# Patient Record
Sex: Male | Born: 1975 | Race: Black or African American | Hispanic: No | Marital: Single | Smoking: Current every day smoker
Health system: Southern US, Community
[De-identification: ages and names within clinical notes are randomized; demographics above are authoritative.]

## PROBLEM LIST (undated history)

## (undated) HISTORY — PX: EYE SURGERY: SHX253

---

## 2012-07-09 ENCOUNTER — Ambulatory Visit (INDEPENDENT_AMBULATORY_CARE_PROVIDER_SITE_OTHER): Payer: BC Managed Care – PPO | Admitting: Family Medicine

## 2012-07-09 VITALS — BP 126/80 | HR 91 | Temp 98.2°F | Resp 16 | Ht 70.0 in | Wt 197.4 lb

## 2012-07-09 DIAGNOSIS — M7918 Myalgia, other site: Secondary | ICD-10-CM

## 2012-07-09 DIAGNOSIS — L0231 Cutaneous abscess of buttock: Secondary | ICD-10-CM

## 2012-07-09 DIAGNOSIS — IMO0001 Reserved for inherently not codable concepts without codable children: Secondary | ICD-10-CM

## 2012-07-09 MED ORDER — SULFAMETHOXAZOLE-TRIMETHOPRIM 800-160 MG PO TABS: 1.0000 | ORAL_TABLET | Freq: Two times a day (BID) | ORAL | Status: DC

## 2012-07-09 MED ORDER — HYDROCODONE-ACETAMINOPHEN 5-325 MG PO TABS: 1.0000 | ORAL_TABLET | Freq: Four times a day (QID) | ORAL | Status: DC | PRN

## 2012-07-09 NOTE — Progress Notes (Signed)
Subjective: 37 year old man here for his first time. He has had for 4 days of pain in his left buttock. He just started and he doesn't know why. Constantly worse. He can't sit down. He is a Community education officer. He lives with his girlfriend, but she doesn't have any similar problems. Is not allergic to any medications on a regular medicines. He has been healthy.  Objective: Pleasant young man who is very uncomfortable. He can't sit down. He has left buttock it has a lot of swelling medially. Superior to and lateral to the anus is a large abscess. Area of induration probably is in the neighborhood of 7 cm. It is warm to touch.  Assessment: Left buttock abscess  Plan: He will require I and D. and antibiotics and pain medications.

## 2012-07-09 NOTE — Progress Notes (Signed)
   Patient ID: DONTRAE MORINI MRN: [id number], DOB: April 18, 1976, 37 y.o. Date of Encounter: 07/09/2012, 10:54 AM    PROCEDURE NOTE: Verbal consent obtained. Risks and benefits of the procedure were explained to the patient. Patient made an informed decision to proceed with the procedure. Betadine prep per usual protocol. Local anesthesia obtained with 1% plain lidocaine 3 cc.  1 cm incision made with 11 blade along lesion.  Culture taken. Copious purulence expressed. Lesion explored revealing no loculations. Irrigated with normal saline. Packed with 1/4 plain packing. Dressed. Wound care instructions including precautions with patient. Patient tolerated the procedure well. Recheck in 48 hours.     Signed, Eula Listen, PA-C 07/09/2012 10:54 AM

## 2012-07-11 ENCOUNTER — Ambulatory Visit (INDEPENDENT_AMBULATORY_CARE_PROVIDER_SITE_OTHER): Payer: BC Managed Care – PPO | Admitting: Physician Assistant

## 2012-07-11 ENCOUNTER — Encounter: Payer: Self-pay | Admitting: Physician Assistant

## 2012-07-11 VITALS — BP 116/76 | HR 85 | Temp 98.5°F | Resp 16 | Ht 70.0 in | Wt 193.2 lb

## 2012-07-11 DIAGNOSIS — L03317 Cellulitis of buttock: Secondary | ICD-10-CM

## 2012-07-11 DIAGNOSIS — L0231 Cutaneous abscess of buttock: Secondary | ICD-10-CM

## 2012-07-11 LAB — WOUND CULTURE

## 2012-07-11 NOTE — Progress Notes (Signed)
   Patient ID: DINA MOBLEY MRN: [id number], DOB: [birth year] 37 y.o. Date of Encounter: 07/11/2012, 8:42 AM  Primary Physician: No primary provider on file.  Chief Complaint: Wound care   See previous note  HPI: 37 y.o. y/o male presents for wound care s/p I&D on 07/10/12 Doing well No issues or complaints Afebrile/ no chills No nausea or vomiting Tolerating Bactrim Pain much improved Daily dressing change Previous note reviewed  No past medical history on file.   Home Meds: Prior to Admission medications   Medication Sig Start Date End Date Taking? Authorizing Provider  HYDROcodone-acetaminophen (NORCO/VICODIN) 5-325 MG per tablet Take 1 tablet by mouth every 6 (six) hours as needed for pain. 07/09/12  Yes Ryan M Dunn, PA-C  sulfamethoxazole-trimethoprim (BACTRIM DS,SEPTRA DS) 800-160 MG per tablet Take 1 tablet by mouth 2 (two) times daily. 07/09/12  Yes Ryan M Dunn, PA-C    Allergies: No Known Allergies  ROS: Constitutional: Afebrile, no chills Cardiovascular: negative for chest pain or palpitations Dermatological: Positive for wound and pain (improved). Negative for erythema or warmth  GI: No nausea or vomiting   EXAM: Physical Exam: Blood pressure 116/76, pulse 85, temperature 98.5 F (36.9 C), temperature source Oral, resp. rate 16, height 5\' 10"  (1.778 m), weight 193 lb 3.2 oz (87.635 kg), SpO2 98.00%., Body mass index is 27.72 kg/(m^2). General: Well developed, well nourished, in no acute distress. Nontoxic appearing. Head: Normocephalic, atraumatic, sclera non-icteric.  Neck: Supple. Lungs: Breathing is unlabored. Heart: Normal rate. Skin:  Warm and moist. Dressing and packing in place. No induration or erythema. Much improved tenderness to palpation. Neuro: Alert and oriented X 3. Moves all extremities spontaneously. Normal gait.  Psych:  Responds to questions appropriately with a normal affect.   PROCEDURE: Dressing and packing removed. No purulence  expressed Wound bed healthy Irrigated with 1% plain lidocaine 5 cc. Repacked with 1/4 inch plain packing Dressing applied  LAB: Culture: Preliminary, multiple organisms, none predominant.   A/P: 37 y.o. y/o male with improving cellulitis/abscess as above s/p I&D on 07/10/12 -Wound care per above -Continue Bactrim -Pain well controlled, and much improved -Daily dressing changes -Recheck 48 hours  Signed, Eula Listen, PA-C 07/11/2012 8:42 AM

## 2012-07-13 ENCOUNTER — Ambulatory Visit (INDEPENDENT_AMBULATORY_CARE_PROVIDER_SITE_OTHER): Payer: BC Managed Care – PPO | Admitting: Physician Assistant

## 2012-07-13 VITALS — BP 138/88 | HR 88 | Temp 98.4°F | Resp 16 | Ht 69.25 in | Wt 198.2 lb

## 2012-07-13 DIAGNOSIS — L0231 Cutaneous abscess of buttock: Secondary | ICD-10-CM

## 2012-07-13 DIAGNOSIS — L03317 Cellulitis of buttock: Secondary | ICD-10-CM

## 2012-07-13 NOTE — Progress Notes (Signed)
Patient ID: Corey Waller MRN: [id number], DOB: 29-Mar-1976 37 y.o. Date of Encounter: 07/13/2012, 9:17 AM  Chief Complaint: Wound care   See previous note  HPI: 37 y.o. y/o male presents for wound care s/p I&D on 07/09/12. Doing well No issues or complaints Afebrile/ no chills No nausea or vomiting Tolerating bactrim. Pain resolved. Packing fell out last night while changing dressing.  Daily dressing change Previous note reviewed  No past medical history on file.   Home Meds: Prior to Admission medications   Medication Sig Start Date End Date Taking? Authorizing Provider  HYDROcodone-acetaminophen (NORCO/VICODIN) 5-325 MG per tablet Take 1 tablet by mouth every 6 (six) hours as needed for pain. 07/09/12  Yes Ryan M Dunn, PA-C  sulfamethoxazole-trimethoprim (BACTRIM DS,SEPTRA DS) 800-160 MG per tablet Take 1 tablet by mouth 2 (two) times daily. 07/09/12  Yes Ryan M Dunn, PA-C    Allergies: No Known Allergies  ROS: Constitutional: Afebrile, no chills Cardiovascular: negative for chest pain or palpitations Dermatological: Positive for wound. Negative for erythema, pain, or warmth.  GI: No nausea or vomiting   EXAM: Physical Exam: Blood pressure 138/88, pulse 88, temperature 98.4 F (36.9 C), temperature source Oral, resp. rate 16, height 5' 9.25" (1.759 m), weight 198 lb 3.2 oz (89.903 kg), SpO2 100.00%., Body mass index is 29.06 kg/(m^2). General: Well developed, well nourished, in no acute distress. Nontoxic appearing. Head: Normocephalic, atraumatic, sclera non-icteric.  Neck: Supple. Lungs: Breathing is unlabored. Heart: Normal rate. Skin:  Warm and moist. Dressing and packing in place. No induration, erythema, or tenderness to palpation. Neuro: Alert and oriented X 3. Moves all extremities spontaneously. Normal gait.  Psych:  Responds to questions appropriately with a normal affect.       PROCEDURE: Dressing removed. Packing not in place.  No purulence  expressed Wound bed healthy Irrigated with 1% plain lidocaine 5 cc. Not repacked.  Dressing applied  LAB: Culture:   A/P: 37 y.o. y/o male with buttocks cellulitis/abscess as above s/p I&D on Wound care per above Continue bactrim. Pain well controlled Daily dressing changes Recheck as needed.   Grier Mitts, PA-C 07/13/2012 9:17 AM

## 2014-11-29 ENCOUNTER — Other Ambulatory Visit (HOSPITAL_COMMUNITY): Payer: Self-pay | Admitting: Chiropractic Medicine

## 2014-11-29 ENCOUNTER — Ambulatory Visit (HOSPITAL_COMMUNITY)
Admission: RE | Admit: 2014-11-29 | Discharge: 2014-11-29 | Disposition: A | Discharge status: Home / Self Care | Payer: BLUE CROSS/BLUE SHIELD | Source: Ambulatory Visit | Attending: Chiropractic Medicine | Admitting: Chiropractic Medicine

## 2014-11-29 DIAGNOSIS — G8929 Other chronic pain: Principal | ICD-10-CM

## 2014-11-29 DIAGNOSIS — M5032 Other cervical disc degeneration, mid-cervical region: Secondary | ICD-10-CM | POA: Insufficient documentation

## 2014-11-29 DIAGNOSIS — M2578 Osteophyte, vertebrae: Secondary | ICD-10-CM | POA: Insufficient documentation

## 2014-11-29 DIAGNOSIS — M542 Cervicalgia: Secondary | ICD-10-CM

## 2014-11-29 DIAGNOSIS — M549 Dorsalgia, unspecified: Secondary | ICD-10-CM | POA: Diagnosis not present

## 2014-11-29 DIAGNOSIS — M546 Pain in thoracic spine: Secondary | ICD-10-CM | POA: Diagnosis not present
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
## 2017-05-23 ENCOUNTER — Encounter (HOSPITAL_COMMUNITY): Payer: Self-pay | Admitting: Emergency Medicine

## 2017-05-23 ENCOUNTER — Emergency Department (HOSPITAL_COMMUNITY)
Admission: EM | Admit: 2017-05-23 | Discharge: 2017-05-23 | Disposition: A | Discharge status: Home / Self Care | Payer: BLUE CROSS/BLUE SHIELD | Attending: Emergency Medicine | Admitting: Emergency Medicine

## 2017-05-23 ENCOUNTER — Emergency Department (HOSPITAL_COMMUNITY): Payer: BLUE CROSS/BLUE SHIELD

## 2017-05-23 DIAGNOSIS — Z79899 Other long term (current) drug therapy: Secondary | ICD-10-CM | POA: Diagnosis not present

## 2017-05-23 DIAGNOSIS — M940 Chondrocostal junction syndrome [Tietze]: Secondary | ICD-10-CM | POA: Diagnosis not present

## 2017-05-23 DIAGNOSIS — R079 Chest pain, unspecified: Secondary | ICD-10-CM | POA: Diagnosis present

## 2017-05-23 LAB — BASIC METABOLIC PANEL
ANION GAP: 8 (ref 5–15)
BUN: 8 mg/dL (ref 6–20)
CHLORIDE: 104 mmol/L (ref 101–111)
CO2: 24 mmol/L (ref 22–32)
CREATININE: 1.27 mg/dL — AB (ref 0.61–1.24)
Calcium: 9.3 mg/dL (ref 8.9–10.3)
GFR calc non Af Amer: >60 mL/min (ref >60)
GLUCOSE: 89 mg/dL (ref 65–99)
POTASSIUM: 4.1 mmol/L (ref 3.5–5.1)
SODIUM: 136 mmol/L (ref 135–145)

## 2017-05-23 LAB — I-STAT TROPONIN, ED: Troponin i, poc: 0 ng/mL (ref 0.00–0.08)

## 2017-05-23 LAB — CBC
HCT: 47.3 % (ref 39.0–52.0)
Hemoglobin: 15.7 g/dL (ref 13.0–17.0)
MCH: 30 pg (ref 26.0–34.0)
MCHC: 33.2 g/dL (ref 30.0–36.0)
MCV: 90.4 fL (ref 78.0–100.0)
PLATELETS: 242 10*3/uL (ref 150–400)
RBC: 5.23 MIL/uL (ref 4.22–5.81)
RDW: 13.2 % (ref 11.5–15.5)
WBC: 4.5 10*3/uL (ref 4.0–10.5)

## 2017-05-23 MED ORDER — KETOROLAC TROMETHAMINE 60 MG/2ML IM SOLN
30.0000 mg | Freq: Once | INTRAMUSCULAR | Status: AC
  Administered 2017-05-23: 30 mg via INTRAMUSCULAR
  Filled 2017-05-23: qty 2

## 2017-05-23 NOTE — Discharge Instructions (Signed)
Please return for any problem.  Please use ibuprofen at home for symptomatic relief.  Recommended dose of Ibuprofen is 600-800 mg orally every 8 hours.

## 2017-05-23 NOTE — ED Triage Notes (Signed)
Pt to ER for evaluation of 2 day hx of central chest pain worse with palpation and movement, states he was doing "nothing" when the pain began. States no medical hx. Denies any other symptoms. States had a cough 2 weeks ago and has continued to cough. Pt a/o x4.

## 2017-05-23 NOTE — ED Provider Notes (Signed)
[facility]   CSN: [id number] Arrival date & time: 05/23/17  1205     History   Chief Complaint Chief Complaint  Patient presents with  . Chest Pain    HPI Corey Waller is a 41 y.o. male.  41 year old male without significant medical history presents with complaint of anterior chest wall pain.  He reports a mild URI about 1-1/2 weeks ago.  He reports significant coughing while he had that URI.  He now presents with complaint of left midsternal chest discomfort over the last 3 days.  This pain is worse with palpation.  It is worse with movement or deep breath.  He denies associated nausea, diaphoresis, vomiting, shortness of breath, or lower extremity discomfort or swelling.  He denies prior CAD.  He denies prior history of hypertension or hyperlipidemia.     The history is provided by the patient.  Chest Pain   This is a new problem. The current episode started more than 2 days ago. The problem occurs constantly. The problem has not changed since onset.The pain is associated with movement, coughing and lifting. The pain is present in the substernal region. The pain is mild. The quality of the pain is described as stabbing. The pain does not radiate. The symptoms are aggravated by certain positions and deep breathing. He has tried rest for the symptoms. The treatment provided mild relief.    History reviewed. No pertinent past medical history.  There are no active problems to display for this patient.   Past Surgical History:  Procedure Laterality Date  . EYE SURGERY         Home Medications    Prior to Admission medications   Medication Sig Start Date End Date Taking? Authorizing Provider  HYDROcodone-acetaminophen (NORCO/VICODIN) 5-325 MG per tablet Take 1 tablet by mouth every 6 (six) hours as needed for pain. 07/09/12   Dunn, Raymon Muttonyan M, PA-C  sulfamethoxazole-trimethoprim (BACTRIM DS,SEPTRA DS) 800-160 MG per tablet  Take 1 tablet by mouth 2 (two) times daily. 07/09/12   Sondra Bargesunn, Ryan M, PA-C    Family History History reviewed. No pertinent family history.  Social History Social History   Tobacco Use  . Smoking status: Current Every Day Smoker    Packs/day: 1.00    Years: 15.00    Pack years: 15.00  . Smokeless tobacco: Never Used  Substance Use Topics  . Alcohol use: Yes  . Drug use: No     Allergies   Patient has no known allergies.   Review of Systems Review of Systems  Cardiovascular: Positive for chest pain.  All other systems reviewed and are negative.    Physical Exam Updated Vital Signs BP (!) 143/103 (BP Location: Right Arm)   Pulse 73   Temp 98.1 F (36.7 C) (Oral)   Resp 18   SpO2 98%   Physical Exam  Constitutional: He is oriented to person, place, and time. He appears well-developed and well-nourished. No distress.  HENT:  Head: Normocephalic and atraumatic.  Mouth/Throat: Oropharynx is clear and moist.  Eyes: Conjunctivae and EOM are normal. Pupils are equal, round, and reactive to light.  Neck: Normal range of motion. Neck supple.  Cardiovascular: Normal rate, regular rhythm and normal heart sounds.  Tender to palpation to left sternal border  Palpation reproduces symptoms entirely.    Pulmonary/Chest: Effort normal and breath sounds normal. No respiratory distress.  Abdominal: Soft. He exhibits no distension. There is no tenderness.  Musculoskeletal: Normal  range of motion. He exhibits no edema or deformity.  Neurological: He is alert and oriented to person, place, and time.  Skin: Skin is warm and dry.  Psychiatric: He has a normal mood and affect.  Nursing note and vitals reviewed.    ED Treatments / Results  Labs (all labs ordered are listed, but only abnormal results are displayed) Labs Reviewed  BASIC METABOLIC PANEL - Abnormal; Notable for the following components:      Result Value   Creatinine, Ser 1.27 (*)    All other components within  normal limits  CBC  I-STAT TROPONIN, ED    EKG  EKG Interpretation  Date/Time:  Sunday May 23 2017 12:12:52 EST Ventricular Rate:  78 PR Interval:  132 QRS Duration: 88 QT Interval:  352 QTC Calculation: 401 R Axis:   84 Text Interpretation:  Normal sinus rhythm Normal ECG Confirmed by Kristine RoyalMessick, [place] Natarajan [id number]) on 05/23/2017 1:32:45 PM       Radiology Dg Chest 2 View  Result Date: 05/23/2017 CLINICAL DATA:  Midsternal chest pain for the past 3 days. EXAM: CHEST  2 VIEW COMPARISON:  None. FINDINGS: The heart size and mediastinal contours are within normal limits. Both lungs are clear. The visualized skeletal structures are unremarkable. IMPRESSION: No active cardiopulmonary disease. Electronically Signed   By: Obie DredgeWilliam T Derry M.D.   On: 05/23/2017 13:16    Procedures Procedures (including critical care time)  Medications Ordered in ED Medications  ketorolac (TORADOL) injection 30 mg (not administered)     Initial Impression / Assessment and Plan / ED Course  I have reviewed the triage vital signs and the nursing notes.  Pertinent labs & imaging results that were available during my care of the patient were reviewed by me and considered in my medical decision making (see chart for details).     MSE Complete   Presentation is entirely consistent with costochondritis.  Pain is reproduced with palpation to the anterior left sternal border.  Symptoms present for the last 3 days.  EKG is normal and without acute ischemic changes.  Troponin x1 is negative. CXR is normal.  Patient educated regarding costochondritis treatment at home.  Strict return precautions are given and understood.  Close follow-up is advised.  Final Clinical Impressions(s) / ED Diagnoses   Final diagnoses:  Costochondritis    ED Discharge Orders    None       Wynetta FinesMessick, Jomarion Mish C, MD 05/23/17 1349

## 2018-04-22 IMAGING — DX DG CHEST 2V
2 series · 2 of 2 positions shown · non-contrast
Comparison: None.

CLINICAL DATA: Midsternal chest pain for the past 3 days.

EXAM:
CHEST  2 VIEW

[chest pa]
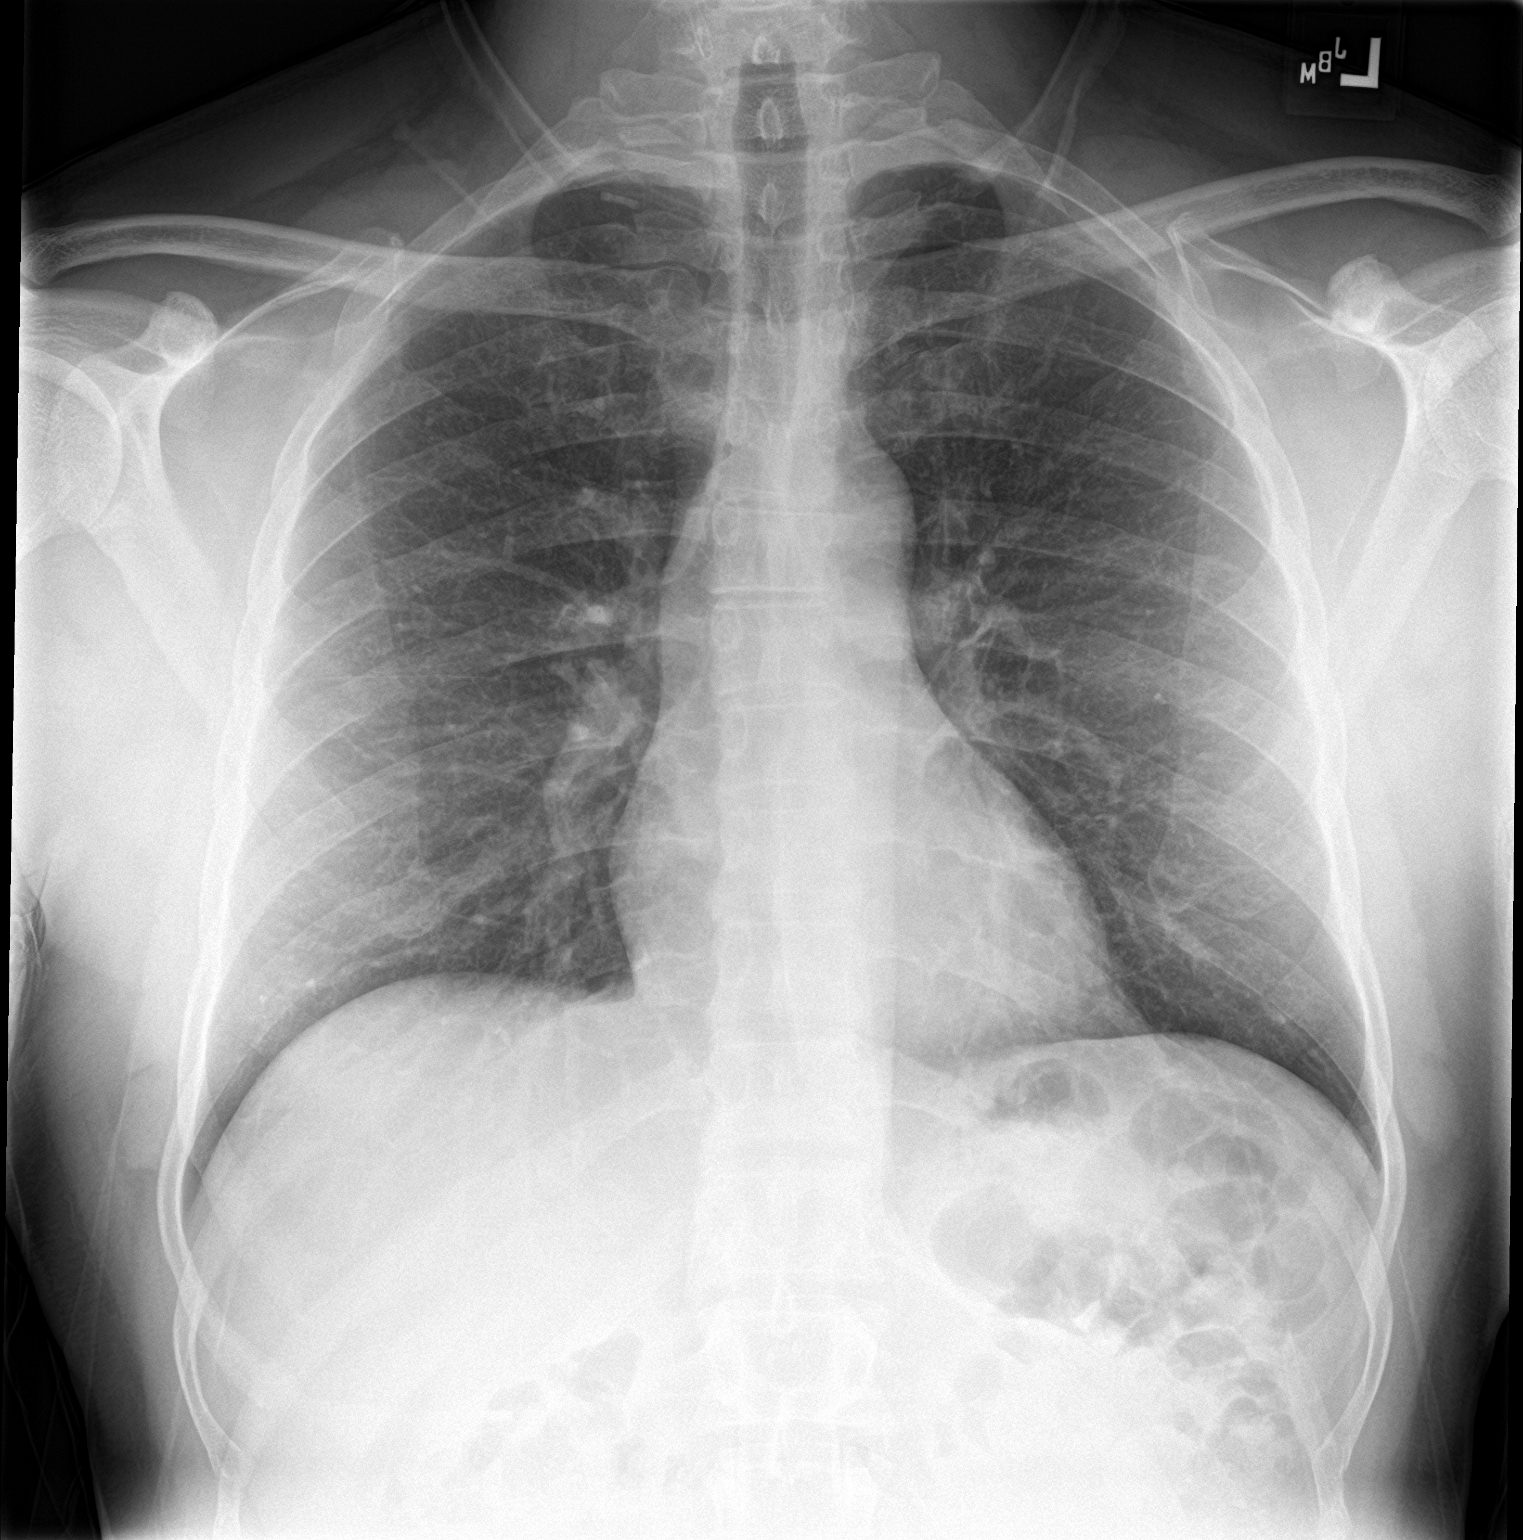

[chest lat]
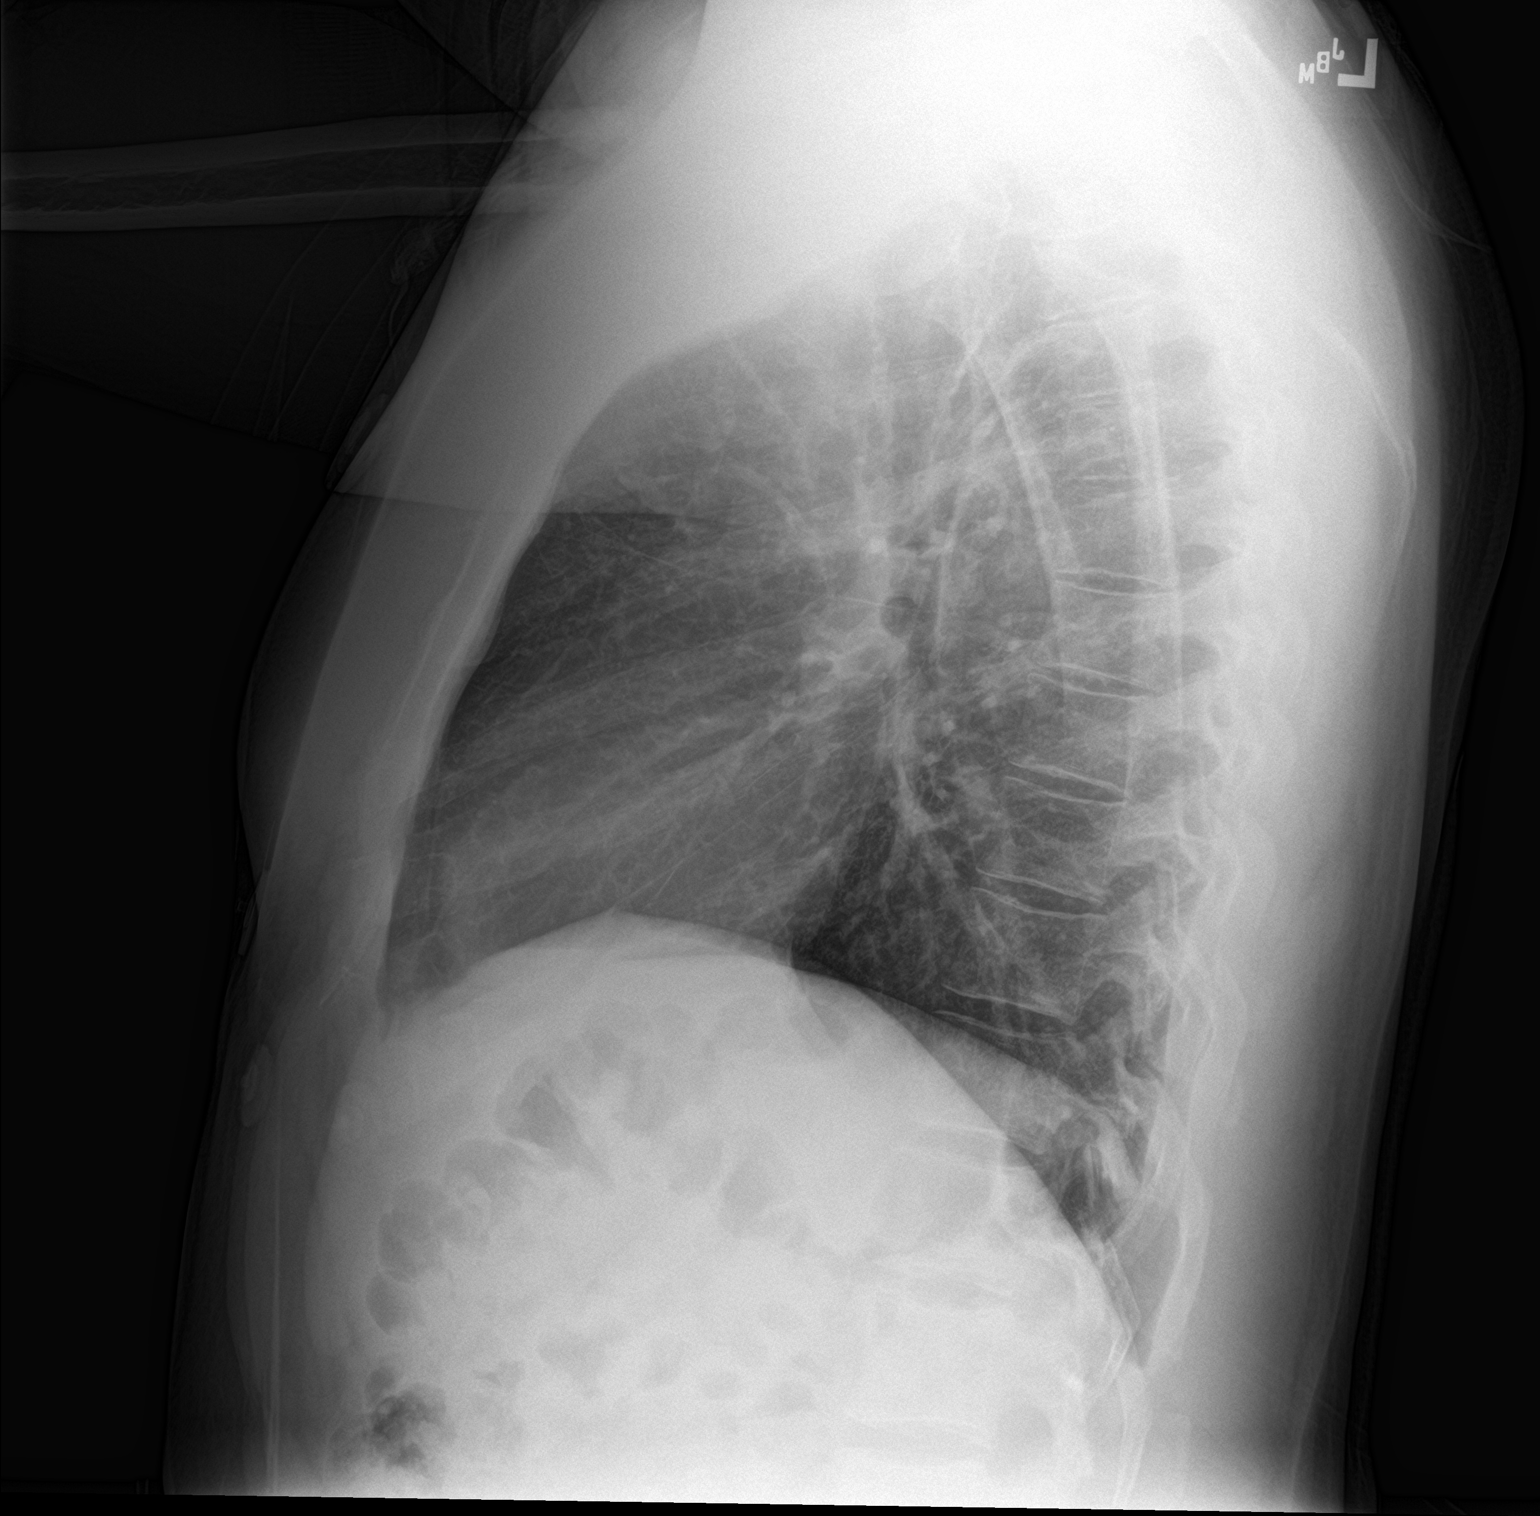

[2 of 2 positions shown; findings below may reference images not displayed]

FINDINGS: The heart size and mediastinal contours are within normal limits.
Both lungs are clear. The visualized skeletal structures are
unremarkable.
IMPRESSION: No active cardiopulmonary disease.

## 2021-08-08 DIAGNOSIS — J039 Acute tonsillitis, unspecified: Secondary | ICD-10-CM | POA: Diagnosis not present

## 2021-08-08 DIAGNOSIS — R070 Pain in throat: Secondary | ICD-10-CM | POA: Diagnosis not present

## 2022-07-27 DIAGNOSIS — Z720 Tobacco use: Secondary | ICD-10-CM | POA: Diagnosis not present

## 2022-08-23 DIAGNOSIS — R062 Wheezing: Secondary | ICD-10-CM | POA: Diagnosis not present

## 2022-08-23 DIAGNOSIS — R059 Cough, unspecified: Secondary | ICD-10-CM | POA: Diagnosis not present

## 2022-08-23 DIAGNOSIS — J101 Influenza due to other identified influenza virus with other respiratory manifestations: Secondary | ICD-10-CM | POA: Diagnosis not present

## 2022-09-01 DIAGNOSIS — M7022 Olecranon bursitis, left elbow: Secondary | ICD-10-CM | POA: Diagnosis not present

## 2022-09-20 DIAGNOSIS — Z720 Tobacco use: Secondary | ICD-10-CM | POA: Diagnosis not present

## 2022-09-28 DIAGNOSIS — Z720 Tobacco use: Secondary | ICD-10-CM | POA: Diagnosis not present

## 2022-12-22 DIAGNOSIS — M79671 Pain in right foot: Secondary | ICD-10-CM | POA: Diagnosis not present

## 2022-12-22 DIAGNOSIS — L923 Foreign body granuloma of the skin and subcutaneous tissue: Secondary | ICD-10-CM | POA: Diagnosis not present

## 2023-03-08 DIAGNOSIS — M778 Other enthesopathies, not elsewhere classified: Secondary | ICD-10-CM | POA: Diagnosis not present

## 2023-03-08 DIAGNOSIS — M19041 Primary osteoarthritis, right hand: Secondary | ICD-10-CM | POA: Diagnosis not present

## 2023-04-08 ENCOUNTER — Ambulatory Visit (HOSPITAL_BASED_OUTPATIENT_CLINIC_OR_DEPARTMENT_OTHER): Payer: BC Managed Care – PPO | Admitting: Family Medicine

## 2023-04-08 ENCOUNTER — Encounter (HOSPITAL_BASED_OUTPATIENT_CLINIC_OR_DEPARTMENT_OTHER): Payer: Self-pay | Admitting: Family Medicine

## 2023-04-08 VITALS — BP 152/103 | HR 70 | Ht 70.0 in | Wt 212.0 lb

## 2023-04-08 DIAGNOSIS — R030 Elevated blood-pressure reading, without diagnosis of hypertension: Secondary | ICD-10-CM | POA: Insufficient documentation

## 2023-04-08 DIAGNOSIS — F17200 Nicotine dependence, unspecified, uncomplicated: Secondary | ICD-10-CM | POA: Insufficient documentation

## 2023-04-08 DIAGNOSIS — Z1211 Encounter for screening for malignant neoplasm of colon: Secondary | ICD-10-CM

## 2023-04-08 DIAGNOSIS — Z0000 Encounter for general adult medical examination without abnormal findings: Secondary | ICD-10-CM

## 2023-04-08 DIAGNOSIS — R519 Headache, unspecified: Secondary | ICD-10-CM | POA: Diagnosis not present

## 2023-04-08 NOTE — Assessment & Plan Note (Signed)
Has attempted quitting in the past.  Did not note significant nicotine cravings with use of nicotine replacement therapy.  Primary concerns are related to mood changes, primarily related to anxiety.  He also noted weight gain when quitting smoking previously.  We discussed considerations related to this.  If he did well from a screening standpoint, we could look to interventions to assist with controlling symptoms which occur with tobacco cessation, primarily related to anxiety.  Patient will consider and we will continue to assess readiness to quit at future office visits

## 2023-04-08 NOTE — Assessment & Plan Note (Signed)
Possibly related to workout supplements, also reviewed that caffeine can also be trigger for headache.  Patient does regularly consume coffee and was also getting caffeine through preworkout.  Total caffeine intake has decreased with elimination of preworkout.  Advised on looking to utilize a preworkout supplement that does not have caffeine to see if this can be tolerated without triggering of headaches. Advised on maintaining headache diary should these return.  Recommend tracking headache frequency, severity.  Also recommend monitoring dietary intake including food and beverages, water intake, nightly sleep.

## 2023-04-08 NOTE — Progress Notes (Signed)
New Patient Office Visit  Subjective    Patient ID: Corey Waller, male    DOB: 11/24/1975  Age: 47 y.o. MRN: [id number]  CC:  Chief Complaint  Patient presents with   New Patient (Initial Visit)    New patient a few weeks ago was having headaches every single day this has since went away     HPI Corey Waller presents to establish care Last PCP - never had one  Has had some recent headaches -reports a few weeks ago he was having daily headaches.  He generally was using preworkout with caffeine as well as creatine as part of exercise regimen.  He indicates that he stopped both of these workout supplements and headaches subsided shortly thereafter.  He generally has not had any issues with headaches in the past.  Tobacco use: has smoked about 20 years, about 1 ppd. Did try to quit earlier this year, quit for about 3-4 months. Noted increased anxiety and weight gain when not smoking. Utilized patch at that time.  Denies any prior hospitalizations; has had LASIK surgery in the past  FH - reports HTN in family. Not aware of any cancer in the family.  Patient is originally from [place]. Has lived here for about 17 years. Patient works running Programme researcher, broadcasting/film/video - Research officer, trade union. Outside of work, he enjoys exercising, has to do this early in the morning due to working long hours.  Outpatient Encounter Medications as of 04/08/2023  Medication Sig   [DISCONTINUED] HYDROcodone-acetaminophen (NORCO/VICODIN) 5-325 MG per tablet Take 1 tablet by mouth every 6 (six) hours as needed for pain. (Patient not taking: Reported on 04/08/2023)   [DISCONTINUED] sulfamethoxazole-trimethoprim (BACTRIM DS,SEPTRA DS) 800-160 MG per tablet Take 1 tablet by mouth 2 (two) times daily.   No facility-administered encounter medications on file as of 04/08/2023.    History reviewed. No pertinent past medical history.  Past Surgical History:  Procedure Laterality Date   EYE SURGERY      History  reviewed. No pertinent family history.  Social History   Socioeconomic History   Marital status: Single    Spouse name: Not on file   Number of children: Not on file   Years of education: Not on file   Highest education level: 12th grade  Occupational History   Not on file  Tobacco Use   Smoking status: Every Day    Current packs/day: 1.00    Average packs/day: 1 pack/day for 15.0 years (15.0 ttl pk-yrs)    Types: Cigarettes    Passive exposure: Current   Smokeless tobacco: Never  Vaping Use   Vaping status: Never Used  Substance and Sexual Activity   Alcohol use: Yes   Drug use: No   Sexual activity: Not on file  Other Topics Concern   Not on file  Social History Narrative   Not on file   Social Determinants of Health   Financial Resource Strain: Low Risk  (04/07/2023)   Overall Financial Resource Strain (CARDIA)    Difficulty of Paying Living Expenses: Not hard at all  Food Insecurity: No Food Insecurity (04/07/2023)   Hunger Vital Sign    Worried About Running Out of Food in the Last Year: Never true    Ran Out of Food in the Last Year: Never true  Transportation Needs: No Transportation Needs (04/07/2023)   PRAPARE - Administrator, Civil Service (Medical): No    Lack of Transportation (Non-Medical): No  Physical Activity: Sufficiently  Active (04/07/2023)   Exercise Vital Sign    Days of Exercise per Week: 4 days    Minutes of Exercise per Session: 60 min  Stress: No Stress Concern Present (04/07/2023)   [employer] of Occupational Health - Occupational Stress Questionnaire    Feeling of Stress : Only a little  Social Connections: Socially Isolated (04/07/2023)   Social Connection and Isolation Panel [NHANES]    Frequency of Communication with Friends and Family: Twice a week    Frequency of Social Gatherings with Friends and Family: Never    Attends Religious Services: Never    Database administrator or Organizations: No    Attends Museum/gallery exhibitions officer: Not on file    Marital Status: Divorced  Intimate Partner Violence: Not At Risk (04/08/2023)   Humiliation, Afraid, Rape, and Kick questionnaire    Fear of Current or Ex-Partner: No    Emotionally Abused: No    Physically Abused: No    Sexually Abused: No    Objective    BP (!) 152/103 (BP Location: Left Arm, Patient Position: Sitting, Cuff Size: Normal)   Pulse 70   Ht 5\' 10"  (1.778 m)   Wt 212 lb (96.2 kg)   SpO2 100%   BMI 30.42 kg/m   Physical Exam  47 year old male in no acute distress Cardiovascular exam with regular rate and rhythm Lungs clear to auscultation bilaterally  Assessment & Plan:   Elevated blood pressure reading in office without diagnosis of hypertension Assessment & Plan: Blood pressure borderline in office today, no prior history of hypertension.  He does have family history of blood pressure issues and thus we will need to keep a close eye on blood pressure.  Recommend lifestyle modifications with general limitation of added salt to the diet.  He is currently regularly active with his gym routine.  We will continue to monitor blood pressure in the office and determine need for pharmacotherapy   Special screening for malignant neoplasms, colon -     Cologuard  Wellness examination -     CBC with Differential/Platelet; Future -     Comprehensive metabolic panel; Future -     Hemoglobin A1c; Future -     Lipid panel; Future -     TSH Rfx on Abnormal to Free T4; Future  Tobacco use disorder Assessment & Plan: Has attempted quitting in the past.  Did not note significant nicotine cravings with use of nicotine replacement therapy.  Primary concerns are related to mood changes, primarily related to anxiety.  He also noted weight gain when quitting smoking previously.  We discussed considerations related to this.  If he did well from a screening standpoint, we could look to interventions to assist with controlling symptoms which occur  with tobacco cessation, primarily related to anxiety.  Patient will consider and we will continue to assess readiness to quit at future office visits   Nonintractable headache, unspecified chronicity pattern, unspecified headache type Assessment & Plan: Possibly related to workout supplements, also reviewed that caffeine can also be trigger for headache.  Patient does regularly consume coffee and was also getting caffeine through preworkout.  Total caffeine intake has decreased with elimination of preworkout.  Advised on looking to utilize a preworkout supplement that does not have caffeine to see if this can be tolerated without triggering of headaches. Advised on maintaining headache diary should these return.  Recommend tracking headache frequency, severity.  Also recommend monitoring dietary intake including food and beverages, water  intake, nightly sleep.   Return in about 1 month (around 05/09/2023) for CPE with fasting labs 1 week prior.    ___________________________________________ Everlee [place]ush [place], MD, ABFM, CAQSM Primary Care and Sports Medicine [facility]

## 2023-04-08 NOTE — Assessment & Plan Note (Signed)
Blood pressure borderline in office today, no prior history of hypertension.  He does have family history of blood pressure issues and thus we will need to keep a close eye on blood pressure.  Recommend lifestyle modifications with general limitation of added salt to the diet.  He is currently regularly active with his gym routine.  We will continue to monitor blood pressure in the office and determine need for pharmacotherapy

## 2023-04-21 DIAGNOSIS — Z1211 Encounter for screening for malignant neoplasm of colon: Secondary | ICD-10-CM | POA: Diagnosis not present

## 2023-04-28 LAB — COLOGUARD: COLOGUARD: NEGATIVE

## 2023-05-05 ENCOUNTER — Other Ambulatory Visit (HOSPITAL_BASED_OUTPATIENT_CLINIC_OR_DEPARTMENT_OTHER): Payer: Self-pay | Admitting: Family Medicine

## 2023-05-05 ENCOUNTER — Other Ambulatory Visit (HOSPITAL_BASED_OUTPATIENT_CLINIC_OR_DEPARTMENT_OTHER): Payer: BC Managed Care – PPO

## 2023-05-05 DIAGNOSIS — Z0000 Encounter for general adult medical examination without abnormal findings: Secondary | ICD-10-CM | POA: Diagnosis not present

## 2023-05-06 LAB — COMPREHENSIVE METABOLIC PANEL
ALT: 14 [IU]/L (ref 0–44)
AST: 16 [IU]/L (ref 0–40)
Albumin: 4.2 g/dL (ref 4.1–5.1)
Alkaline Phosphatase: 63 [IU]/L (ref 44–121)
BUN/Creatinine Ratio: 7 — ABNORMAL LOW (ref 9–20)
BUN: 9 mg/dL (ref 6–24)
Bilirubin Total: 0.5 mg/dL (ref 0.0–1.2)
CO2: 22 mmol/L (ref 20–29)
Calcium: 9.6 mg/dL (ref 8.7–10.2)
Chloride: 103 mmol/L (ref 96–106)
Creatinine, Ser: 1.32 mg/dL — ABNORMAL HIGH (ref 0.76–1.27)
Globulin, Total: 2.7 g/dL (ref 1.5–4.5)
Glucose: 99 mg/dL (ref 70–99)
Potassium: 4.3 mmol/L (ref 3.5–5.2)
Sodium: 139 mmol/L (ref 134–144)
Total Protein: 6.9 g/dL (ref 6.0–8.5)
eGFR: 67 mL/min/{1.73_m2} (ref >59)

## 2023-05-06 LAB — LIPID PANEL
Chol/HDL Ratio: 10.4 ratio — ABNORMAL HIGH (ref 0.0–5.0)
Cholesterol, Total: 333 mg/dL — ABNORMAL HIGH (ref 100–199)
HDL: 32 mg/dL — ABNORMAL LOW (ref >39)
LDL Chol Calc (NIH): 259 mg/dL — ABNORMAL HIGH (ref 0–99)
Triglycerides: 205 mg/dL — ABNORMAL HIGH (ref 0–149)
VLDL Cholesterol Cal: 42 mg/dL — ABNORMAL HIGH (ref 5–40)

## 2023-05-06 LAB — CBC WITH DIFFERENTIAL/PLATELET
Basophils Absolute: 0 10*3/uL (ref 0.0–0.2)
Basos: 0 %
EOS (ABSOLUTE): 0.1 10*3/uL (ref 0.0–0.4)
Eos: 2 %
Hematocrit: 47.1 % (ref 37.5–51.0)
Hemoglobin: 15.6 g/dL (ref 13.0–17.7)
Immature Grans (Abs): 0 10*3/uL (ref 0.0–0.1)
Immature Granulocytes: 0 %
Lymphocytes Absolute: 2.5 10*3/uL (ref 0.7–3.1)
Lymphs: 54 %
MCH: 29.2 pg (ref 26.6–33.0)
MCHC: 33.1 g/dL (ref 31.5–35.7)
MCV: 88 fL (ref 79–97)
Monocytes Absolute: 0.5 10*3/uL (ref 0.1–0.9)
Monocytes: 12 %
Neutrophils Absolute: 1.5 10*3/uL (ref 1.4–7.0)
Neutrophils: 32 %
Platelets: 232 10*3/uL (ref 150–450)
RBC: 5.34 x10E6/uL (ref 4.14–5.80)
RDW: 13.1 % (ref 11.6–15.4)
WBC: 4.7 10*3/uL (ref 3.4–10.8)

## 2023-05-06 LAB — HEMOGLOBIN A1C
Est. average glucose Bld gHb Est-mCnc: 123 mg/dL
Hgb A1c MFr Bld: 5.9 % — ABNORMAL HIGH (ref 4.8–5.6)

## 2023-05-06 LAB — TSH RFX ON ABNORMAL TO FREE T4: TSH: 1.05 u[IU]/mL (ref 0.450–4.500)

## 2023-05-12 ENCOUNTER — Ambulatory Visit (INDEPENDENT_AMBULATORY_CARE_PROVIDER_SITE_OTHER): Payer: BC Managed Care – PPO | Admitting: Family Medicine

## 2023-05-12 ENCOUNTER — Encounter (HOSPITAL_BASED_OUTPATIENT_CLINIC_OR_DEPARTMENT_OTHER): Payer: Self-pay | Admitting: Family Medicine

## 2023-05-12 VITALS — BP 138/98 | HR 97 | Ht 70.0 in | Wt 206.7 lb

## 2023-05-12 DIAGNOSIS — Z23 Encounter for immunization: Secondary | ICD-10-CM

## 2023-05-12 DIAGNOSIS — E785 Hyperlipidemia, unspecified: Secondary | ICD-10-CM | POA: Insufficient documentation

## 2023-05-12 DIAGNOSIS — Z0000 Encounter for general adult medical examination without abnormal findings: Secondary | ICD-10-CM | POA: Insufficient documentation

## 2023-05-12 MED ORDER — ROSUVASTATIN CALCIUM 20 MG PO TABS: 20.0000 mg | ORAL_TABLET | Freq: Every day | ORAL | 1 refills | Status: DC

## 2023-05-12 NOTE — Progress Notes (Signed)
Subjective:    CC: Annual Physical Exam  HPI: Corey Waller is a 47 y.o. presenting for annual physical  I reviewed the past medical history, family history, social history, surgical history, and allergies today and no changes were needed.  Please see the problem list section below in epic for further details.  Past Medical History: History reviewed. No pertinent past medical history. Past Surgical History: Past Surgical History:  Procedure Laterality Date   EYE SURGERY     Social History: Social History   Socioeconomic History   Marital status: Single    Spouse name: Not on file   Number of children: Not on file   Years of education: Not on file   Highest education level: 12th grade  Occupational History   Not on file  Tobacco Use   Smoking status: Every Day    Current packs/day: 1.00    Average packs/day: 1 pack/day for 15.0 years (15.0 ttl pk-yrs)    Types: Cigarettes    Passive exposure: Current   Smokeless tobacco: Never  Vaping Use   Vaping status: Never Used  Substance and Sexual Activity   Alcohol use: Yes   Drug use: No   Sexual activity: Not on file  Other Topics Concern   Not on file  Social History Narrative   Not on file   Social Determinants of Health   Financial Resource Strain: Low Risk  (04/07/2023)   Overall Financial Resource Strain (CARDIA)    Difficulty of Paying Living Expenses: Not hard at all  Food Insecurity: No Food Insecurity (04/07/2023)   Hunger Vital Sign    Worried About Running Out of Food in the Last Year: Never true    Ran Out of Food in the Last Year: Never true  Transportation Needs: No Transportation Needs (04/07/2023)   PRAPARE - Administrator, Civil Service (Medical): No    Lack of Transportation (Non-Medical): No  Physical Activity: Sufficiently Active (04/07/2023)   Exercise Vital Sign    Days of Exercise per Week: 4 days    Minutes of Exercise per Session: 60 min  Stress: No Stress Concern Present  (04/07/2023)   [employer] of Occupational Health - Occupational Stress Questionnaire    Feeling of Stress : Only a little  Social Connections: Socially Isolated (04/07/2023)   Social Connection and Isolation Panel [NHANES]    Frequency of Communication with Friends and Family: Twice a week    Frequency of Social Gatherings with Friends and Family: Never    Attends Religious Services: Never    Database administrator or Organizations: No    Attends Engineer, structural: Not on file    Marital Status: Divorced   Family History: History reviewed. No pertinent family history. Allergies: No Known Allergies Medications: See med rec.  Review of Systems: No headache, visual changes, nausea, vomiting, diarrhea, constipation, dizziness, abdominal pain, skin rash, fevers, chills, night sweats, swollen lymph nodes, weight loss, chest pain, body aches, joint swelling, muscle aches, shortness of breath, mood changes, visual or auditory hallucinations.  Objective:    BP (!) 136/90 (BP Location: Left Arm, Patient Position: Sitting, Cuff Size: Normal)   Pulse 97   Ht 5\' 10"  (1.778 m)   Wt 206 lb 11.2 oz (93.8 kg)   SpO2 96%   BMI 29.66 kg/m   General: Well Developed, well nourished, and in no acute distress.  Neuro: Alert and oriented x3, extra-ocular muscles intact, sensation grossly intact. Cranial nerves II through  XII are intact, motor, sensory, and coordinative functions are all intact. HEENT: Normocephalic, atraumatic, pupils equal round reactive to light, neck supple, no masses, no lymphadenopathy, thyroid nonpalpable. Oropharynx, nasopharynx, external ear canals are unremarkable. Arcus cornealis present Skin: Warm and dry, no rashes noted.  Cardiac: Regular rate and rhythm, no murmurs rubs or gallops.  Respiratory: Clear to auscultation bilaterally. Not using accessory muscles, speaking in full sentences.  Abdominal: Soft, nontender, nondistended, positive bowel sounds, no  masses, no organomegaly.  Musculoskeletal: Shoulder, elbow, wrist, hip, knee, ankle stable, and with full range of motion.  Impression and Recommendations:    Need for tetanus booster -     Tdap vaccine greater than or equal to 7yo IM  Wellness examination Assessment & Plan: Routine HCM labs reviewed. HCM reviewed/discussed. Anticipatory guidance regarding healthy weight, lifestyle and choices given. Recommend healthy diet.  Recommend approximately 150 minutes/week of moderate intensity exercise Recommend regular dental and vision exams Always use seatbelt/lap and shoulder restraints Recommend using smoke alarms and checking batteries at least twice a year Recommend using sunscreen when outside Discussed colon cancer screening recommendations, options.  Patient is UTD Discussed tetanus immunization recommendations, patient agreed to proceed with this today   Hyperlipidemia, unspecified hyperlipidemia type Assessment & Plan: Notably elevated total cholesterol and LDL cholesterol on recent labs.  On exam today, patient also noted to have arcus cornealis present.  Patient is over age of 72 and so uncertain if arcus cornealis present prior to age 44.  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  This in conjunction with level of LDL elevation likely indicates familial hypercholesterolemia based upon [place] [facility] network diagnostic criteria (5 points for LDL level, 4 points for arcus cornealis).  Does report family history of heart disease, denies any history of heart attack. We discussed options today.  I do think that patient requires pharmacotherapy to assist with lowering cholesterol numbers.  We discussed option of initiating statin, referral to cardiology/lipid clinic.  After discussion, patient elected to proceed with initial statin therapy.  We discussed potential risk and side effects related to this class of  medication.  Will start with rosuvastatin at 20 mg and monitor lipid panel about 2 months to assess response.  We will target goal of less than 70-100 for LDL.  This would correlate to reduction of greater than 60% LDL.   Other orders -     Rosuvastatin Calcium; Take 1 tablet (20 mg total) by mouth daily.  Dispense: 90 tablet; Refill: 1  Return in about 4 months (around 09/09/2023) for hyperlipidemia, med check, blood pressure.   ___________________________________________ Johnanna Bakke de [place], MD, ABFM, CAQSM Primary Care and Sports Medicine [facility]

## 2023-05-12 NOTE — Assessment & Plan Note (Addendum)
Notably elevated total cholesterol and LDL cholesterol on recent labs.  On exam today, patient also noted to have arcus cornealis present.  Patient is over age of 55 and so uncertain if arcus cornealis present prior to age 47.  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  This in conjunction with level of LDL elevation likely indicates familial hypercholesterolemia based upon [place] [facility] network diagnostic criteria (5 points for LDL level, 4 points for arcus cornealis).  Does report family history of heart disease, denies any history of heart attack. We discussed options today.  I do think that patient requires pharmacotherapy to assist with lowering cholesterol numbers.  We discussed option of initiating statin, referral to cardiology/lipid clinic.  After discussion, patient elected to proceed with initial statin therapy.  We discussed potential risk and side effects related to this class of medication.  Will start with rosuvastatin at 20 mg and monitor lipid panel about 2 months to assess response.  We will target goal of less than 70-100 for LDL.  This would correlate to reduction of greater than 60% LDL.

## 2023-05-12 NOTE — Assessment & Plan Note (Signed)
Routine HCM labs reviewed. HCM reviewed/discussed. Anticipatory guidance regarding healthy weight, lifestyle and choices given. Recommend healthy diet.  Recommend approximately 150 minutes/week of moderate intensity exercise Recommend regular dental and vision exams Always use seatbelt/lap and shoulder restraints Recommend using smoke alarms and checking batteries at least twice a year Recommend using sunscreen when outside Discussed colon cancer screening recommendations, options.  Patient is UTD Discussed tetanus immunization recommendations, patient agreed to proceed with this today 

## 2023-07-14 ENCOUNTER — Other Ambulatory Visit (HOSPITAL_BASED_OUTPATIENT_CLINIC_OR_DEPARTMENT_OTHER): Payer: BC Managed Care – PPO | Admitting: Family Medicine

## 2023-07-14 ENCOUNTER — Other Ambulatory Visit (HOSPITAL_BASED_OUTPATIENT_CLINIC_OR_DEPARTMENT_OTHER): Payer: BC Managed Care – PPO

## 2023-07-14 ENCOUNTER — Other Ambulatory Visit (HOSPITAL_BASED_OUTPATIENT_CLINIC_OR_DEPARTMENT_OTHER): Payer: Self-pay | Admitting: Family Medicine

## 2023-07-14 DIAGNOSIS — E785 Hyperlipidemia, unspecified: Secondary | ICD-10-CM | POA: Diagnosis not present

## 2023-07-15 LAB — LIPID PANEL
Chol/HDL Ratio: 4.5 {ratio} (ref 0.0–5.0)
Cholesterol, Total: 158 mg/dL (ref 100–199)
HDL: 35 mg/dL — ABNORMAL LOW (ref >39)
LDL Chol Calc (NIH): 106 mg/dL — ABNORMAL HIGH (ref 0–99)
Triglycerides: 91 mg/dL (ref 0–149)
VLDL Cholesterol Cal: 17 mg/dL (ref 5–40)

## 2023-07-19 ENCOUNTER — Encounter (HOSPITAL_BASED_OUTPATIENT_CLINIC_OR_DEPARTMENT_OTHER): Payer: Self-pay | Admitting: Family Medicine

## 2023-09-15 ENCOUNTER — Encounter (HOSPITAL_BASED_OUTPATIENT_CLINIC_OR_DEPARTMENT_OTHER): Payer: Self-pay | Admitting: Family Medicine

## 2023-09-15 ENCOUNTER — Ambulatory Visit (HOSPITAL_BASED_OUTPATIENT_CLINIC_OR_DEPARTMENT_OTHER): Payer: BC Managed Care – PPO | Admitting: Family Medicine

## 2023-09-15 VITALS — BP 129/86 | HR 70 | Ht 70.0 in | Wt 209.8 lb

## 2023-09-15 DIAGNOSIS — R61 Generalized hyperhidrosis: Secondary | ICD-10-CM | POA: Insufficient documentation

## 2023-09-15 DIAGNOSIS — E785 Hyperlipidemia, unspecified: Secondary | ICD-10-CM | POA: Diagnosis not present

## 2023-09-15 DIAGNOSIS — R030 Elevated blood-pressure reading, without diagnosis of hypertension: Secondary | ICD-10-CM

## 2023-09-15 NOTE — Assessment & Plan Note (Signed)
 Blood pressure elevated in office today, did improve on recheck.  He does have family history of blood pressure issues and thus we will need to keep a close eye on blood pressure.  Recommend lifestyle modifications with general limitation of added salt to the diet.  He is currently regularly active with his gym routine.  We will continue to monitor blood pressure in the office and determine need for pharmacotherapy Recommend intermittent monitoring of blood pressure at home, DASH diet

## 2023-09-15 NOTE — Assessment & Plan Note (Signed)
 Initially noted some myalgias with starting medication, however these have largely resolved.  Primarily affected shoulders at onset.  No current symptoms of muscle aches. At this time, can continue with rosuvastatin at current dose.  We will continue to monitor cholesterol panel moving forward.

## 2023-09-15 NOTE — Patient Instructions (Signed)
  Medication Instructions:  Your physician recommends that you continue on your current medications as directed. Please refer to the Current Medication list given to you today. --If you need a refill on any your medications before your next appointment, please call your pharmacy first. If no refills are authorized on file call the office.-- Lab Work: Your physician has recommended that you have lab work today: today If you have labs (blood work) drawn today and your tests are completely normal, you will receive your results via MyChart message OR a phone call from our staff.  Please ensure you check your voicemail in the event that you authorized detailed messages to be left on a delegated number. If you have any lab test that is abnormal or we need to change your treatment, we will call you to review the results.    Follow-Up: Your next appointment:   Your physician recommends that you schedule a follow-up appointment in: 3 month follow up with Dr. de [place]  You will receive a text message or e-mail with a link to a survey about your care and experience with [place] today! We would greatly appreciate your feedback!   Thanks for letting us be apart of your health journey!!  Primary Care and Sports Medicine   Dr. Ceasar Mons [place]   We encourage you to activate your patient portal called "MyChart".  Sign up information is provided on this After Visit Summary.  MyChart is used to connect with patients for Virtual Visits (Telemedicine).  Patients are able to view lab/test results, encounter notes, upcoming appointments, etc.  Non-urgent messages can be sent to your provider as well. To learn more about what you can do with MyChart, please visit --  [email or web address].

## 2023-09-15 NOTE — Progress Notes (Signed)
    Procedures performed today:    None.  Independent interpretation of notes and tests performed by another provider:   None.  Brief History, Exam, Impression, and Recommendations:    BP 129/86 (BP Location: Right Arm, Patient Position: Sitting, Cuff Size: Normal)   Pulse 70   Ht 5\' 10"  (1.778 m)   Wt 209 lb 12.8 oz (95.2 kg)   SpO2 96%   BMI 30.10 kg/m   Hyperlipidemia, unspecified hyperlipidemia type Assessment & Plan: Initially noted some myalgias with starting medication, however these have largely resolved.  Primarily affected shoulders at onset.  No current symptoms of muscle aches. At this time, can continue with rosuvastatin at current dose.  We will continue to monitor cholesterol panel moving forward.   Night sweats Assessment & Plan: Going on for about 4 to 6 weeks.  Reports finding himself drenched with sweat at night.  This occurs most nights out of the week.  Has not had any other symptoms such as fever, weight changes, chest pain or abdominal pain.  No change in bowel movement.  Labs a few months ago were normal. On exam, patient is in no acute distress, vital signs stable.  Cardiovascular exam with regular rate and rhythm.  Lungs clear to auscultation bilaterally. Uncertain etiology for night sweats.  Can proceed with labs as below.  Further recommendations pending results of labs.  He also has questions about possible low testosterone.  Discussed that this is not a common cause of night sweats and typically would expect other symptoms such as decreased libido, erectile dysfunction, mood changes.  Ultimately, can proceed with checking testosterone level as well  Orders: -     CBC with Differential/Platelet -     TSH -     Hepatic function panel -     C-reactive protein -     HIV Antibody (routine testing w rflx) -     Testosterone  Elevated blood pressure reading in office without diagnosis of hypertension Assessment & Plan: Blood pressure elevated in office  today, did improve on recheck.  He does have family history of blood pressure issues and thus we will need to keep a close eye on blood pressure.  Recommend lifestyle modifications with general limitation of added salt to the diet.  He is currently regularly active with his gym routine.  We will continue to monitor blood pressure in the office and determine need for pharmacotherapy Recommend intermittent monitoring of blood pressure at home, DASH diet   Return in about 3 months (around 12/16/2023) for hyperlipidemia, blood pressure.   ___________________________________________ Josaphine Shimamoto de [place], MD, ABFM, [facility] Primary Care and Sports Medicine [facility]

## 2023-09-15 NOTE — Assessment & Plan Note (Addendum)
 Going on for about 4 to 6 weeks.  Reports finding himself drenched with sweat at night.  This occurs most nights out of the week.  Has not had any other symptoms such as fever, weight changes, chest pain or abdominal pain.  No change in bowel movement.  Labs a few months ago were normal. On exam, patient is in no acute distress, vital signs stable.  Cardiovascular exam with regular rate and rhythm.  Lungs clear to auscultation bilaterally. Uncertain etiology for night sweats.  Can proceed with labs as below.  Further recommendations pending results of labs.  He also has questions about possible low testosterone.  Discussed that this is not a common cause of night sweats and typically would expect other symptoms such as decreased libido, erectile dysfunction, mood changes.  Ultimately, can proceed with checking testosterone level as well

## 2023-09-16 LAB — CBC WITH DIFFERENTIAL/PLATELET
Basophils Absolute: 0 10*3/uL (ref 0.0–0.2)
Basos: 0 %
EOS (ABSOLUTE): 0.1 10*3/uL (ref 0.0–0.4)
Eos: 1 %
Hematocrit: 47.8 % (ref 37.5–51.0)
Hemoglobin: 15.9 g/dL (ref 13.0–17.7)
Immature Grans (Abs): 0 10*3/uL (ref 0.0–0.1)
Immature Granulocytes: 0 %
Lymphocytes Absolute: 2 10*3/uL (ref 0.7–3.1)
Lymphs: 40 %
MCH: 29.3 pg (ref 26.6–33.0)
MCHC: 33.3 g/dL (ref 31.5–35.7)
MCV: 88 fL (ref 79–97)
Monocytes Absolute: 0.6 10*3/uL (ref 0.1–0.9)
Monocytes: 12 %
Neutrophils Absolute: 2.3 10*3/uL (ref 1.4–7.0)
Neutrophils: 47 %
Platelets: 237 10*3/uL (ref 150–450)
RBC: 5.42 x10E6/uL (ref 4.14–5.80)
RDW: 12.8 % (ref 11.6–15.4)
WBC: 4.9 10*3/uL (ref 3.4–10.8)

## 2023-09-16 LAB — HIV ANTIBODY (ROUTINE TESTING W REFLEX): HIV Screen 4th Generation wRfx: NONREACTIVE

## 2023-09-16 LAB — HEPATIC FUNCTION PANEL
ALT: 67 IU/L — ABNORMAL HIGH (ref 0–44)
AST: 60 IU/L — ABNORMAL HIGH (ref 0–40)
Albumin: 4.6 g/dL (ref 4.1–5.1)
Alkaline Phosphatase: 62 IU/L (ref 44–121)
Bilirubin Total: 0.6 mg/dL (ref 0.0–1.2)
Bilirubin, Direct: 0.2 mg/dL (ref 0.00–0.40)
Total Protein: 7.1 g/dL (ref 6.0–8.5)

## 2023-09-16 LAB — C-REACTIVE PROTEIN: CRP: 3 mg/L (ref 0–10)

## 2023-09-16 LAB — TESTOSTERONE: Testosterone: 597 ng/dL (ref 264–916)

## 2023-09-16 LAB — TSH: TSH: 1.42 u[IU]/mL (ref 0.450–4.500)

## 2023-09-17 ENCOUNTER — Other Ambulatory Visit (HOSPITAL_BASED_OUTPATIENT_CLINIC_OR_DEPARTMENT_OTHER): Payer: Self-pay | Admitting: Family Medicine

## 2023-09-17 DIAGNOSIS — R7401 Elevation of levels of liver transaminase levels: Secondary | ICD-10-CM

## 2023-09-17 DIAGNOSIS — E785 Hyperlipidemia, unspecified: Secondary | ICD-10-CM

## 2023-11-09 ENCOUNTER — Other Ambulatory Visit (HOSPITAL_BASED_OUTPATIENT_CLINIC_OR_DEPARTMENT_OTHER): Payer: Self-pay | Admitting: Family Medicine

## 2023-12-22 ENCOUNTER — Ambulatory Visit (HOSPITAL_BASED_OUTPATIENT_CLINIC_OR_DEPARTMENT_OTHER): Admitting: Family Medicine

## 2024-02-09 ENCOUNTER — Ambulatory Visit (HOSPITAL_BASED_OUTPATIENT_CLINIC_OR_DEPARTMENT_OTHER): Admitting: Family Medicine

## 2024-02-25 ENCOUNTER — Ambulatory Visit (HOSPITAL_BASED_OUTPATIENT_CLINIC_OR_DEPARTMENT_OTHER): Admitting: Family Medicine

## 2024-02-25 ENCOUNTER — Encounter (HOSPITAL_BASED_OUTPATIENT_CLINIC_OR_DEPARTMENT_OTHER): Payer: Self-pay | Admitting: Family Medicine

## 2024-02-25 VITALS — BP 141/88 | HR 85 | Ht 70.0 in | Wt 217.3 lb

## 2024-02-25 DIAGNOSIS — E785 Hyperlipidemia, unspecified: Secondary | ICD-10-CM | POA: Diagnosis not present

## 2024-02-25 DIAGNOSIS — R7401 Elevation of levels of liver transaminase levels: Secondary | ICD-10-CM

## 2024-02-25 DIAGNOSIS — R61 Generalized hyperhidrosis: Secondary | ICD-10-CM

## 2024-02-25 NOTE — Progress Notes (Unsigned)
    Procedures performed today:    None.  Independent interpretation of notes and tests performed by another provider:   None.  Brief History, Exam, Impression, and Recommendations:    BP (!) 141/88 (BP Location: Left Arm, Patient Position: Sitting, Cuff Size: Large)   Pulse 85   Ht 5' 10 (1.778 m)   Wt 217 lb 4.8 oz (98.6 kg)   SpO2 99%   BMI 31.18 kg/m   Patient is overdue for follow-up.  Patient was last seen in March with recommendation for 66-month follow-up.  Returns today to follow-up on below issues.  On prior labs, AST and ALT were both found to be slightly elevated.  Recommendation was to return and have repeat testing completed about 2 to 3 months later, however patient did not have this done.  Denies any current issues or symptoms.  Hyperlipidemia, unspecified hyperlipidemia type  Transaminitis  Night sweats  Return in about 3 months (around 05/26/2024) for CPE with fasting labs 1 week prior.   ___________________________________________ Roberto Romanoski de [place], MD, ABFM, CAQSM Primary Care and Sports Medicine [facility]

## 2024-02-25 NOTE — Patient Instructions (Signed)
  Medication Instructions:  Your physician recommends that you continue on your current medications as directed. Please refer to the Current Medication list given to you today. --If you need a refill on any your medications before your next appointment, please call your pharmacy first. If no refills are authorized on file call the office.-- Lab Work: Your physician has recommended that you have lab work today: fasting next week If you have labs (blood work) drawn today and your tests are completely normal, you will receive your results via MyChart message OR a phone call from our staff.  Please ensure you check your voicemail in the event that you authorized detailed messages to be left on a delegated number. If you have any lab test that is abnormal or we need to change your treatment, we will call you to review the results.   Follow-Up: Your next appointment:   Your physician recommends that you schedule a follow-up appointment in: 3-4 months physical  with Dr. de [place]  You will receive a text message or e-mail with a link to a survey about your care and experience with us  today! We would greatly appreciate your feedback!   Thanks for letting us  be apart of your health journey!!  Primary Care and Sports Medicine   Dr. Quintin sheerer [place]   We encourage you to activate your patient portal called MyChart.  Sign up information is provided on this After Visit Summary.  MyChart is used to connect with patients for Virtual Visits (Telemedicine).  Patients are able to view lab/test results, encounter notes, upcoming appointments, etc.  Non-urgent messages can be sent to your provider as well. To learn more about what you can do with MyChart, please visit --  [email or web address].

## 2024-02-29 NOTE — Assessment & Plan Note (Signed)
 On prior labs, AST and ALT were both found to be slightly elevated.  Recommendation was to return and have repeat testing completed about 2 to 3 months later, however patient did not have this done.  Denies any current issues or symptoms. Patient will return to have fasting labs completed since we will also be checking cholesterol panel as part of lab work

## 2024-02-29 NOTE — Assessment & Plan Note (Signed)
 Patient continues with rosuvastatin .  He does report that there was 1 week that he did not take medication when he was traveling.  Otherwise he has been taking full dose on a daily basis.  With prior mild transaminitis, we did recommend reducing dose of medication, but patient has been taking full dose recently. He can continue with full daily dose with plan to recheck labs in the next 1 to 2 weeks.  Further recommendations pending results of labs

## 2024-02-29 NOTE — Assessment & Plan Note (Signed)
 Continues to have intermittent issues, however the frequency of the night sweats has decreased compared to prior.  We did complete labs previously and these were generally reassuring without any obvious abnormality which would explain issues with night sweats.

## 2024-03-01 ENCOUNTER — Ambulatory Visit (HOSPITAL_BASED_OUTPATIENT_CLINIC_OR_DEPARTMENT_OTHER)

## 2024-03-01 ENCOUNTER — Ambulatory Visit (HOSPITAL_BASED_OUTPATIENT_CLINIC_OR_DEPARTMENT_OTHER): Admitting: Family Medicine

## 2024-03-01 ENCOUNTER — Other Ambulatory Visit (HOSPITAL_BASED_OUTPATIENT_CLINIC_OR_DEPARTMENT_OTHER): Payer: Self-pay | Admitting: *Deleted

## 2024-03-01 DIAGNOSIS — R7401 Elevation of levels of liver transaminase levels: Secondary | ICD-10-CM | POA: Diagnosis not present

## 2024-03-01 DIAGNOSIS — E785 Hyperlipidemia, unspecified: Secondary | ICD-10-CM | POA: Diagnosis not present

## 2024-03-02 ENCOUNTER — Ambulatory Visit (HOSPITAL_BASED_OUTPATIENT_CLINIC_OR_DEPARTMENT_OTHER): Payer: Self-pay | Admitting: Family Medicine

## 2024-03-02 DIAGNOSIS — Z0000 Encounter for general adult medical examination without abnormal findings: Secondary | ICD-10-CM

## 2024-03-02 LAB — HEPATIC FUNCTION PANEL
ALT: 42 IU/L (ref 0–44)
AST: 35 IU/L (ref 0–40)
Albumin: 4.4 g/dL (ref 4.1–5.1)
Alkaline Phosphatase: 60 IU/L (ref 44–121)
Bilirubin Total: 0.5 mg/dL (ref 0.0–1.2)
Bilirubin, Direct: 0.17 mg/dL (ref 0.00–0.40)
Total Protein: 7 g/dL (ref 6.0–8.5)

## 2024-03-02 LAB — LIPID PANEL
Chol/HDL Ratio: 3.9 ratio (ref 0.0–5.0)
Cholesterol, Total: 163 mg/dL (ref 100–199)
HDL: 42 mg/dL (ref >39)
LDL Chol Calc (NIH): 101 mg/dL — ABNORMAL HIGH (ref 0–99)
Triglycerides: 107 mg/dL (ref 0–149)
VLDL Cholesterol Cal: 20 mg/dL (ref 5–40)

## 2024-05-17 ENCOUNTER — Ambulatory Visit: Admitting: Family Medicine

## 2024-05-17 ENCOUNTER — Ambulatory Visit: Payer: Self-pay

## 2024-05-17 VITALS — BP 126/80 | HR 75 | Temp 98.5°F | Wt 222.0 lb

## 2024-05-17 DIAGNOSIS — M545 Low back pain, unspecified: Secondary | ICD-10-CM

## 2024-05-17 MED ORDER — CYCLOBENZAPRINE HCL 10 MG PO TABS: 10.0000 mg | ORAL_TABLET | Freq: Three times a day (TID) | ORAL | 0 refills | Status: DC | PRN

## 2024-05-17 MED ORDER — METHYLPREDNISOLONE 4 MG PO TBPK: ORAL_TABLET | ORAL | 0 refills | Status: DC

## 2024-05-17 NOTE — Progress Notes (Signed)
   Subjective:    Patient ID: Corey Waller, male    DOB: 02/10/1976, 48 y.o.   MRN: [id number]  HPI Here for the onset 4 days ago of sharp pains in the lower back that begin in the middle and radiate out to both sides under his shoulder blades. No hx of trauma. He woke up with this pain. He has tried heat and Ibuprofen with no relief. He says he had the same thing happen year ago, and the pain went away after he took a steroid taper pack.    Review of Systems  Constitutional: Negative.   Respiratory: Negative.    Cardiovascular: Negative.   Musculoskeletal:  Positive for back pain.       Objective:   Physical Exam Constitutional:      Appearance: Normal appearance.  Cardiovascular:     Rate and Rhythm: Normal rate and regular rhythm.     Pulses: Normal pulses.     Heart sounds: Normal heart sounds.  Pulmonary:     Effort: Pulmonary effort is normal.     Breath sounds: Normal breath sounds.  Musculoskeletal:     Comments: He is not tender anywhere on his back, but he has pain on extension of his spine. He has no pain with flexion. There is muscle spasm in the middle of his back   Neurological:     Mental Status: He is alert.           Assessment & Plan:  Low back pain. We will treat this with Flexeril  and a Medrol  dose pack. Recheck as needed.  Garnette Olmsted, MD

## 2024-05-17 NOTE — Telephone Encounter (Signed)
 FYI Only or Action Required?: FYI only for provider: appointment scheduled on 11.26.25.  Patient was last seen in primary care on 02/25/2024 by de [place], Quintin PARAS, MD.  Called Nurse Triage reporting Back Pain.  Symptoms began several days ago.  Interventions attempted: Rest, hydration, or home remedies.  Symptoms are: gradually worsening.  Triage Disposition: See HCP Within 4 Hours (Or PCP Triage)  Patient/caregiver understands and will follow disposition?:     Copied from CRM #[id number]. Topic: Clinical - Red Word Triage >> May 17, 2024 10:28 AM [place] R wrote: experianceing pain when he takes a deep breath or when turning a certain way. it feels like his lungs hurts. Pt tried to have a virtual visit with urgent care but was denied his visit because it should have been in person. Reason for Disposition  [1] SEVERE back pain (e.g., excruciating, unable to do any normal activities) AND [2] not improved 2 hours after pain medicine  Additional Information  Commented on: Continuous (nonstop) coughing interferes with work, school, or sleeping    Pt states feels like pleurisy. No cough  Answer Assessment - Initial Assessment Questions Pt states that on Sunday he noticed a pain on his left side of his back, internally, not muscular. He states hurts when he turns, coughs, or tries to take a deep breath. He states about this time last year he had pleurisy and it feels exactly the same. He states he now has the same pain on both side of his lungs. He states it does not radiate into his chest, neck or jaw. He states 8/10 when at its worse so with deep breath or when he's laying down at night. Pt denies fever, cough, wheezing, shortness of breath or chest pain. Denies all respiratory symptoms.     1. ONSET: When did the pain begin? (e.g., minutes, hours, days)     Sunday 2. LOCATION: Where does it hurt? (upper, mid or lower back)     Bilateral upper back 3. SEVERITY: How bad is the pain?   (e.g., Scale 1-10; mild, moderate, or severe)     8/10 with deep breath or laying down 4. PATTERN: Is the pain constant? (e.g., yes, no; constant, intermittent)      intermittent 5. RADIATION: Does the pain shoot into your legs or somewhere else?     denies 6. CAUSE:  What do you think is causing the back pain?      Pt thinks it is pleurisy  Protocols used: Back Pain-A-AH, Breathing Difficulty-A-AH

## 2024-05-26 ENCOUNTER — Ambulatory Visit (HOSPITAL_BASED_OUTPATIENT_CLINIC_OR_DEPARTMENT_OTHER)

## 2024-05-26 ENCOUNTER — Ambulatory Visit

## 2024-05-26 ENCOUNTER — Encounter (HOSPITAL_BASED_OUTPATIENT_CLINIC_OR_DEPARTMENT_OTHER): Payer: Self-pay

## 2024-05-26 ENCOUNTER — Ambulatory Visit (HOSPITAL_BASED_OUTPATIENT_CLINIC_OR_DEPARTMENT_OTHER): Payer: Self-pay

## 2024-05-26 VITALS — BP 130/90 | HR 88 | Temp 98.1°F | Resp 20 | Ht 70.0 in | Wt 215.0 lb

## 2024-05-26 DIAGNOSIS — R002 Palpitations: Secondary | ICD-10-CM

## 2024-05-26 DIAGNOSIS — R61 Generalized hyperhidrosis: Secondary | ICD-10-CM

## 2024-05-26 NOTE — Progress Notes (Unsigned)
 EP to read.

## 2024-05-26 NOTE — Progress Notes (Signed)
 Corey Waller, Your chest xr looks normal which is reassuring. I am waiting to receive your lab results as well. If at any point you begin to have worsening shortness of breath and/or chest pain please go to the ER.  [name]

## 2024-05-26 NOTE — Progress Notes (Signed)
   Acute Office Visit  Subjective:     Patient ID: Corey Waller, male    DOB: 06-06-1976, 48 y.o.   MRN: [id number]  Chief Complaint  Patient presents with   Night Sweats    HPI Corey Waller is a 48 year old male here today to follow-up with a continued experience of night sweats.  Patient was seen in March and then 6 months ago for the same issue.  Patient states hyperhidrosis is noticeable intermittently during the day but worse at night.  Patient denies any new cough, new medications, recent travel, bloody sputum, or any other changes in his day-to-day activities.  Patient did report the past few days his Apple Watch has notified him of a high heart rate - patient is unaware of what his heart rate is at that time.  Patient feels there has to be some calls to the symptoms as he feels that is affecting his day-to-day activities and his job.  Review of Systems  Constitutional:  Positive for diaphoresis. Negative for chills, fever, malaise/fatigue and weight loss.  Respiratory:  Negative for cough, hemoptysis, sputum production and shortness of breath.   Cardiovascular:  Positive for palpitations.  Skin:  Negative for itching and rash.        Objective:    BP (!) 130/90   Pulse 88   Temp 98.1 F (36.7 C) (Oral)   Resp 20   Ht 5' 10 (1.778 m)   Wt 215 lb (97.5 kg)   SpO2 99%   BMI 30.85 kg/m    Physical Exam Constitutional:      Appearance: Normal appearance. He is normal weight.  HENT:     Head: Normocephalic.  Eyes:     Extraocular Movements: Extraocular movements intact.     Pupils: Pupils are equal, round, and reactive to light.  Cardiovascular:     Rate and Rhythm: Normal rate and regular rhythm.  Pulmonary:     Effort: Pulmonary effort is normal.     Breath sounds: Normal breath sounds.  Musculoskeletal:     Cervical back: Normal range of motion.  Skin:    General: Skin is warm and dry.  Neurological:     Mental Status: He is alert.         Assessment & Plan:  1. Palpitations (Primary) Test the potential cause of his hyperhidrosis to be abnormal heart rate in the setting of his  Apple Watch alarms.  We did discuss his EKG results and that they were normal.  Discussed the use of a Zio patch for a closer monitoring of his heart rate and heart rhythm to determine if that is a factor in his hyperhidrosis symptoms. - EKG 12-Lead - LONG TERM MONITOR XT (3-14 DAYS); Future  2. Hyperhidrosis Discussed repeating labs since they were last done 6 months ago.  Discussed to follow-up as needed if symptoms do not continue to improve.  Discussed with the patient any red flag symptoms such as cough, hemoptysis, uncontrolled fevers and or weight loss - CBC with Differential - Comprehensive Metabolic Panel (CMET) - C-reactive protein - TSH Rfx on Abnormal to Free T4 - DG Chest 2 View; Future   Return if symptoms worsen or fail to improve.  Lauraine FORBES Angus, FNP

## 2024-05-27 LAB — TSH RFX ON ABNORMAL TO FREE T4: TSH: 1.13 u[IU]/mL (ref 0.450–4.500)

## 2024-05-31 ENCOUNTER — Encounter (HOSPITAL_BASED_OUTPATIENT_CLINIC_OR_DEPARTMENT_OTHER): Payer: Self-pay

## 2024-05-31 NOTE — Addendum Note (Signed)
 Addended by: Mulki Roesler ELIZABETH on: 05/31/2024 11:49 AM   Modules accepted: Orders

## 2024-06-01 ENCOUNTER — Other Ambulatory Visit: Payer: Self-pay | Admitting: Family Medicine

## 2024-06-02 ENCOUNTER — Other Ambulatory Visit (HOSPITAL_BASED_OUTPATIENT_CLINIC_OR_DEPARTMENT_OTHER): Payer: Self-pay | Admitting: Family Medicine

## 2024-07-05 ENCOUNTER — Ambulatory Visit (HOSPITAL_BASED_OUTPATIENT_CLINIC_OR_DEPARTMENT_OTHER)

## 2024-07-05 ENCOUNTER — Other Ambulatory Visit (HOSPITAL_BASED_OUTPATIENT_CLINIC_OR_DEPARTMENT_OTHER): Payer: Self-pay | Admitting: Family Medicine

## 2024-07-05 DIAGNOSIS — Z0000 Encounter for general adult medical examination without abnormal findings: Secondary | ICD-10-CM

## 2024-07-05 LAB — COMPREHENSIVE METABOLIC PANEL WITH GFR
ALT: 39 IU/L (ref 0–44)
AST: 32 IU/L (ref 0–40)
Albumin: 4.1 g/dL (ref 4.1–5.1)
Alkaline Phosphatase: 52 IU/L (ref 47–123)
BUN/Creatinine Ratio: 6 — ABNORMAL LOW (ref 9–20)
BUN: 9 mg/dL (ref 6–24)
Bilirubin Total: 0.4 mg/dL (ref 0.0–1.2)
CO2: 23 mmol/L (ref 20–29)
Calcium: 9.4 mg/dL (ref 8.7–10.2)
Chloride: 101 mmol/L (ref 96–106)
Creatinine, Ser: 1.49 mg/dL — ABNORMAL HIGH (ref 0.76–1.27)
Globulin, Total: 2.7 g/dL (ref 1.5–4.5)
Glucose: 92 mg/dL (ref 70–99)
Potassium: 4.5 mmol/L (ref 3.5–5.2)
Sodium: 137 mmol/L (ref 134–144)
Total Protein: 6.8 g/dL (ref 6.0–8.5)
eGFR: 57 mL/min/1.73 — ABNORMAL LOW

## 2024-07-05 LAB — CBC WITH DIFFERENTIAL/PLATELET
Basophils Absolute: 0 x10E3/uL (ref 0.0–0.2)
Basos: 0 %
EOS (ABSOLUTE): 0.1 x10E3/uL (ref 0.0–0.4)
Eos: 2 %
Hematocrit: 46.2 % (ref 37.5–51.0)
Hemoglobin: 15.3 g/dL (ref 13.0–17.7)
Immature Grans (Abs): 0 x10E3/uL (ref 0.0–0.1)
Immature Granulocytes: 0 %
Lymphocytes Absolute: 2 x10E3/uL (ref 0.7–3.1)
Lymphs: 45 %
MCH: 29.4 pg (ref 26.6–33.0)
MCHC: 33.1 g/dL (ref 31.5–35.7)
MCV: 89 fL (ref 79–97)
Monocytes Absolute: 0.6 x10E3/uL (ref 0.1–0.9)
Monocytes: 13 %
Neutrophils Absolute: 1.8 x10E3/uL (ref 1.4–7.0)
Neutrophils: 39 %
Platelets: 218 x10E3/uL (ref 150–450)
RBC: 5.2 x10E6/uL (ref 4.14–5.80)
RDW: 13.2 % (ref 11.6–15.4)
WBC: 4.5 x10E3/uL (ref 3.4–10.8)

## 2024-07-05 LAB — LIPID PANEL
Chol/HDL Ratio: 5 ratio (ref 0.0–5.0)
Cholesterol, Total: 175 mg/dL (ref 100–199)
HDL: 35 mg/dL — ABNORMAL LOW
LDL Chol Calc (NIH): 109 mg/dL — ABNORMAL HIGH (ref 0–99)
Triglycerides: 178 mg/dL — ABNORMAL HIGH (ref 0–149)
VLDL Cholesterol Cal: 31 mg/dL (ref 5–40)

## 2024-07-05 LAB — TSH RFX ON ABNORMAL TO FREE T4: TSH: 0.937 u[IU]/mL (ref 0.450–4.500)

## 2024-07-05 LAB — HEMOGLOBIN A1C
Est. average glucose Bld gHb Est-mCnc: 120 mg/dL
Hgb A1c MFr Bld: 5.8 % — ABNORMAL HIGH (ref 4.8–5.6)

## 2024-07-12 ENCOUNTER — Encounter (HOSPITAL_BASED_OUTPATIENT_CLINIC_OR_DEPARTMENT_OTHER): Payer: Self-pay | Admitting: Family Medicine

## 2024-07-12 ENCOUNTER — Ambulatory Visit (INDEPENDENT_AMBULATORY_CARE_PROVIDER_SITE_OTHER): Admitting: Family Medicine

## 2024-07-12 VITALS — BP 135/97 | HR 61 | Temp 98.1°F | Resp 18 | Ht 70.0 in | Wt 223.0 lb

## 2024-07-12 DIAGNOSIS — Z0000 Encounter for general adult medical examination without abnormal findings: Secondary | ICD-10-CM

## 2024-07-12 DIAGNOSIS — R7989 Other specified abnormal findings of blood chemistry: Secondary | ICD-10-CM | POA: Insufficient documentation

## 2024-07-12 DIAGNOSIS — E785 Hyperlipidemia, unspecified: Secondary | ICD-10-CM

## 2024-07-12 NOTE — Assessment & Plan Note (Signed)
 Noted on recent labs, has had slight trend upward on labs over the last year and a half or so.  We will plan to recheck labs before next appointment in 4 to 6 months for monitoring.  Will also need to monitor blood pressure closely as this could additionally be playing a role if continuing to have more readings that are above normal range.

## 2024-07-12 NOTE — Progress Notes (Signed)
 " Subjective:    CC: Annual Physical Exam  HPI:  Corey Waller is a 49 y.o. presenting for annual physical  I reviewed the past medical history, family history, social history, surgical history, and allergies today and no changes were needed.  Please see the problem list section below in epic for further details.  Past Medical History: History reviewed. No pertinent past medical history. Past Surgical History: Past Surgical History:  Procedure Laterality Date   EYE SURGERY     Social History: Social History   Socioeconomic History   Marital status: Single    Spouse name: Not on file   Number of children: Not on file   Years of education: Not on file   Highest education level: 12th grade  Occupational History   Not on file  Tobacco Use   Smoking status: Every Day    Current packs/day: 1.00    Average packs/day: 1 pack/day for 15.0 years (15.0 ttl pk-yrs)    Types: Cigarettes    Passive exposure: Current   Smokeless tobacco: Never  Vaping Use   Vaping status: Never Used  Substance and Sexual Activity   Alcohol use: Yes    Alcohol/week: 3.0 standard drinks of alcohol    Types: 3 Cans of beer per week    Comment: 2-3 a month   Drug use: No   Sexual activity: Yes  Other Topics Concern   Not on file  Social History Narrative   Not on file   Social Drivers of Health   Tobacco Use: High Risk (07/12/2024)   Patient History    Smoking Tobacco Use: Every Day    Smokeless Tobacco Use: Never    Passive Exposure: Current  Financial Resource Strain: Low Risk (05/17/2024)   Overall Financial Resource Strain (CARDIA)    Difficulty of Paying Living Expenses: Not hard at all  Food Insecurity: No Food Insecurity (05/17/2024)   Epic    Worried About Programme Researcher, Broadcasting/film/video in the Last Year: Never true    Ran Out of Food in the Last Year: Never true  Transportation Needs: No Transportation Needs (05/17/2024)   Epic    Lack of Transportation (Medical): No    Lack of  Transportation (Non-Medical): No  Physical Activity: Insufficiently Active (05/17/2024)   Exercise Vital Sign    Days of Exercise per Week: 2 days    Minutes of Exercise per Session: 20 min  Stress: No Stress Concern Present (05/17/2024)   [employer] of Occupational Health - Occupational Stress Questionnaire    Feeling of Stress: Not at all  Social Connections: Moderately Isolated (05/17/2024)   Social Connection and Isolation Panel    Frequency of Communication with Friends and Family: Three times a week    Frequency of Social Gatherings with Friends and Family: Twice a week    Attends Religious Services: Never    Database Administrator or Organizations: No    Attends Engineer, Structural: Not on file    Marital Status: Living with partner  Depression (PHQ2-9): Low Risk (02/25/2024)   Depression (PHQ2-9)    PHQ-2 Score: 0  Alcohol Screen: Low Risk (05/17/2024)   Alcohol Screen    Last Alcohol Screening Score (AUDIT): 2  Housing: Low Risk (05/17/2024)   Epic    Unable to Pay for Housing in the Last Year: No    Number of Times Moved in the Last Year: 0    Homeless in the Last Year: No  Utilities: Not At  Risk (04/08/2023)   [facility] Utilities    Threatened with loss of utilities: No  Health Literacy: Adequate Health Literacy (04/08/2023)   B1300 Health Literacy    Frequency of need for help with medical instructions: Never   Family History: History reviewed. No pertinent family history. Allergies: Allergies[1] Medications: See med rec.  Review of Systems: No headache, visual changes, nausea, vomiting, diarrhea, constipation, dizziness, abdominal pain, skin rash, fevers, chills, night sweats, swollen lymph nodes, weight loss, chest pain, body aches, joint swelling, muscle aches, shortness of breath, mood changes, visual or auditory hallucinations.  Objective:    BP (!) 135/97 (BP Location: Left Arm, Patient Position: Sitting, Cuff Size: Large)   Pulse 61   Temp  98.1 F (36.7 C) (Oral)   Resp 18   Ht 5' 10 (1.778 m)   Wt 223 lb (101.2 kg)   SpO2 99%   BMI 32.00 kg/m   General: Well Developed, well nourished, and in no acute distress.  Neuro: Alert and oriented x3, extra-ocular muscles intact, sensation grossly intact. Cranial nerves II through XII are intact, motor, sensory, and coordinative functions are all intact. HEENT: Normocephalic, atraumatic, pupils equal round reactive to light, neck supple, no masses, no lymphadenopathy, thyroid  nonpalpable. Oropharynx, nasopharynx, external ear canals are unremarkable. Skin: Warm and dry, no rashes noted.  Cardiac: Regular rate and rhythm, no murmurs rubs or gallops.  Respiratory: Clear to auscultation bilaterally. Not using accessory muscles, speaking in full sentences.  Abdominal: Soft, nontender, nondistended, positive bowel sounds, no masses, no organomegaly.  Musculoskeletal: Shoulder, elbow, wrist, hip, knee, ankle stable, and with full range of motion.  Impression and Recommendations:    Wellness examination Assessment & Plan: Routine HCM labs reviewed. HCM reviewed/discussed. Anticipatory guidance regarding healthy weight, lifestyle and choices given. Recommend healthy diet.  Recommend approximately 150 minutes/week of moderate intensity exercise Recommend regular dental and vision exams Always use seatbelt/lap and shoulder restraints Recommend using smoke alarms and checking batteries at least twice a year Recommend using sunscreen when outside Discussed colon cancer screening recommendations, options.  Patient is UTD - due 2027 Discussed immunization recommendations   Hyperlipidemia, unspecified hyperlipidemia type Assessment & Plan: Patient continues with rosuvastatin .  Denies any current concerns related to medication. Recent labs still with slight elevation in LDL.  Discussed goal of LDL lower than 100. Will continue with rosuvastatin , will plan to recheck cholesterol panel before  next appointment.  Orders: -     Lipid panel; Future  Elevated serum creatinine Assessment & Plan: Noted on recent labs, has had slight trend upward on labs over the last year and a half or so.  We will plan to recheck labs before next appointment in 4 to 6 months for monitoring.  Will also need to monitor blood pressure closely as this could additionally be playing a role if continuing to have more readings that are above normal range.  Orders: -     Basic metabolic panel with GFR; Future  Return in about 4 months (around 11/09/2024) for hyperlipidemia, med check with fasting labs 1 week prior.   ___________________________________________ Naeema Patlan de [place], MD, ABFM, CAQSM Primary [facility] Sports [facility]    [1] No Known Allergies  "

## 2024-07-12 NOTE — Assessment & Plan Note (Addendum)
 Patient continues with rosuvastatin .  Denies any current concerns related to medication. Recent labs still with slight elevation in LDL.  Discussed goal of LDL lower than 100. Will continue with rosuvastatin , will plan to recheck cholesterol panel before next appointment.

## 2024-07-12 NOTE — Assessment & Plan Note (Signed)
 Routine HCM labs reviewed. HCM reviewed/discussed. Anticipatory guidance regarding healthy weight, lifestyle and choices given. Recommend healthy diet.  Recommend approximately 150 minutes/week of moderate intensity exercise Recommend regular dental and vision exams Always use seatbelt/lap and shoulder restraints Recommend using smoke alarms and checking batteries at least twice a year Recommend using sunscreen when outside Discussed colon cancer screening recommendations, options.  Patient is UTD - due 2027 Discussed immunization recommendations

## 2024-12-06 ENCOUNTER — Ambulatory Visit (HOSPITAL_BASED_OUTPATIENT_CLINIC_OR_DEPARTMENT_OTHER)

## 2024-12-13 ENCOUNTER — Ambulatory Visit (HOSPITAL_BASED_OUTPATIENT_CLINIC_OR_DEPARTMENT_OTHER): Admitting: Family Medicine
